# Patient Record
Sex: Male | Born: 1950
Health system: Southern US, Community
[De-identification: ages and names within clinical notes are randomized; demographics above are authoritative.]

## PROBLEM LIST (undated history)

## (undated) DIAGNOSIS — I447 Left bundle-branch block, unspecified: Secondary | ICD-10-CM

## (undated) HISTORY — PX: TONSILLECTOMY: SUR1361

## (undated) HISTORY — PX: TRANSURETHRAL RESECTION OF PROSTATE: SHX73

---

## 2002-12-05 ENCOUNTER — Other Ambulatory Visit: Admission: RE | Admit: 2002-12-05 | Discharge: 2002-12-05 | Payer: Self-pay | Admitting: Urology

## 2004-01-18 ENCOUNTER — Ambulatory Visit (HOSPITAL_COMMUNITY): Admission: RE | Admit: 2004-01-18 | Discharge: 2004-01-18 | Payer: Self-pay | Admitting: General Surgery

## 2010-08-23 ENCOUNTER — Emergency Department (HOSPITAL_COMMUNITY): Admission: EM | Admit: 2010-08-23 | Discharge: 2010-08-23 | Payer: Self-pay | Admitting: Emergency Medicine

## 2010-09-02 ENCOUNTER — Ambulatory Visit (HOSPITAL_COMMUNITY): Admission: RE | Admit: 2010-09-02 | Discharge: 2010-09-02 | Payer: Self-pay | Admitting: Urology

## 2010-09-26 ENCOUNTER — Encounter (INDEPENDENT_AMBULATORY_CARE_PROVIDER_SITE_OTHER): Payer: Self-pay | Admitting: Urology

## 2010-09-26 ENCOUNTER — Ambulatory Visit (HOSPITAL_COMMUNITY): Admission: RE | Admit: 2010-09-26 | Discharge: 2010-09-27 | Payer: Self-pay | Admitting: Urology

## 2011-02-07 LAB — CBC
MCHC: 33.7 g/dL (ref 30.0–36.0)
Platelets: 235 10*3/uL (ref 150–400)
RDW: 13.2 % (ref 11.5–15.5)
WBC: 10.3 10*3/uL (ref 4.0–10.5)

## 2011-02-07 LAB — DIFFERENTIAL
Basophils Absolute: 0 10*3/uL (ref 0.0–0.1)
Basophils Relative: 0 % (ref 0–1)
Lymphocytes Relative: 14 % (ref 12–46)
Monocytes Absolute: 0.8 10*3/uL (ref 0.1–1.0)
Neutro Abs: 7.9 10*3/uL — ABNORMAL HIGH (ref 1.7–7.7)
Neutrophils Relative %: 77 % (ref 43–77)

## 2011-02-07 LAB — BASIC METABOLIC PANEL
BUN: 8 mg/dL (ref 6–23)
Calcium: 9 mg/dL (ref 8.4–10.5)
Creatinine, Ser: 0.84 mg/dL (ref 0.4–1.5)
GFR calc non Af Amer: >60 mL/min (ref >60)
Glucose, Bld: 114 mg/dL — ABNORMAL HIGH (ref 70–99)
Potassium: 4 mEq/L (ref 3.5–5.1)

## 2011-02-08 LAB — BASIC METABOLIC PANEL
BUN: 16 mg/dL (ref 6–23)
Chloride: 104 mEq/L (ref 96–112)
GFR calc Af Amer: >60 mL/min (ref >60)
GFR calc non Af Amer: >60 mL/min (ref >60)
Potassium: 4.3 mEq/L (ref 3.5–5.1)
Sodium: 140 mEq/L (ref 135–145)

## 2011-02-08 LAB — HEMOGLOBIN AND HEMATOCRIT, BLOOD
HCT: 41 % (ref 39.0–52.0)
Hemoglobin: 13.9 g/dL (ref 13.0–17.0)

## 2011-02-08 LAB — SURGICAL PCR SCREEN: MRSA, PCR: NEGATIVE

## 2011-04-14 NOTE — H&P (Signed)
NAME:  HAZIM, TREADWAY NO.:  [id number]   MEDICAL RECORD NO.:  [id number]                  PATIENT TYPE:   LOCATION:                                       FACILITY:   PHYSICIAN:  Dalia Heading, M.D.               DATE OF BIRTH:  Sep 05, 1951   DATE OF ADMISSION:  DATE OF DISCHARGE:                                HISTORY & PHYSICAL   CHIEF COMPLAINT:  Need for screening colonoscopy due to age.   HISTORY OF PRESENT ILLNESS:  Patient is a 60 year old white male who was  referred for endoscopic evaluation.  He needs a colonoscopy for screening  purposes.  No abdominal pain, weight loss, nausea, vomiting, diarrhea,  constipation, melena, or hematochezia.  He has never had a colonoscopy.  There is no family history of colon carcinoma.  No history of hemorrhoidal  disease.   PAST MEDICAL HISTORY:  Unremarkable.   PAST SURGICAL HISTORY:  Unremarkable.   CURRENT MEDICATIONS:  Vitorin.   ALLERGIES:  No known drug allergies.   REVIEW OF SYSTEMS:  Noncontributory.   PHYSICAL EXAMINATION:  GENERAL:  Patient is a well-developed and well-  nourished white male in no acute distress.  VITAL SIGNS:  He is afebrile.  Vital signs are stable.  LUNGS:  Clear to auscultation with equal breath sounds bilaterally.  HEART:  Regular rate and rhythm without S3, S4, or murmurs.  ABDOMEN:  Soft, nontender, nondistended.  No hepatosplenomegaly or masses  are noted.  RECTAL:  Deferred at this procedure.   IMPRESSION:  Need for screening colonoscopy.   PLAN:  The patient is scheduled for a colonoscopy on January 18, 2004.  The  risks and benefits of the procedure, including bleeding and perforation,  were fully explained to the patient.  He gave informed consent.     ___________________________________________                                         Dalia Heading, M.D.   MAJ/MEDQ  D:  12/31/2003  T:  12/31/2003  Job:  [id number]   cc:   Patrica Duel, M.D.  [address], [address]  [place]  [place] [address]  Fax: [phone]

## 2013-04-17 NOTE — H&P (Signed)
  NTS SOAP Note  Vital Signs:  Vitals as of: 04/17/2013: Systolic 126: Diastolic 84: Heart Rate 75: Temp 98.12F: Height 42ft 9in: Weight 167Lbs 0 Ounces: BMI 24.66  BMI : 24.66 kg/m2  Subjective: This 81 Years 34 Months old Male presents for screening TCS.  Last had a TCS over ten years ago.  Denies any GI complaints.  No family h/o colon cancer.   Review of Symptoms:  Constitutional:unremarkable   Head:unremarkable    Eyes:unremarkable   Nose/Mouth/Throat:unremarkable Cardiovascular:  unremarkable   Respiratory:unremarkable   Gastrointestinal:  unremarkable   Genitourinary:unremarkable     Musculoskeletal:unremarkable   Skin:unremarkable Hematolgic/Lymphatic:unremarkable     Allergic/Immunologic:unremarkable     Past Medical History:    Reviewed   Past Medical History  Surgical History: TURP Allergies: nkda Medications: vytorin, baby asa   Social History:Reviewed  Social History  Preferred Language: English Race:  White Ethnicity: Not Hispanic / Latino Age: 28 Years 9 Months Marital Status:  M Alcohol:  No Recreational drug(s):  No   Smoking Status: Never smoker reviewed on 04/17/2013 Functional Status reviewed on mm/dd/yyyy ------------------------------------------------ Bathing: Normal Cooking: Normal Dressing: Normal Driving: Normal Eating: Normal Managing Meds: Normal Oral Care: Normal Shopping: Normal Toileting: Normal Transferring: Normal Walking: Normal Cognitive Status reviewed on mm/dd/yyyy ------------------------------------------------ Attention: Normal Decision Making: Normal Language: Normal Memory: Normal Motor: Normal Perception: Normal Problem Solving: Normal Visual and Spatial: Normal   Family History:  Reviewed  Family Health History Mother, Unknown; Healthy;  Father, Unknown; Healthy;     Objective Information: General:  Well appearing, well nourished in no  distress. Heart:  RRR, no murmur Lungs:    CTA bilaterally, no wheezes, rhonchi, rales.  Breathing unlabored. Abdomen:Soft, NT/ND, no HSM, no masses.   deferred to procedure  Assessment:Need for screening TCS  Diagnosis &amp; Procedure Smart Code   Plan: Scheduled for TCS on 04/29/13.  Patient Education:Alternative treatments to surgery were discussed with patient (and family).  Risks and benefits  of procedure including bleeding and perforation were fully explained to the patient (and family) who gave informed consent. Patient/family questions were addressed.  Follow-up:Pending Surgery

## 2013-04-23 ENCOUNTER — Encounter (HOSPITAL_COMMUNITY): Payer: Self-pay | Admitting: Pharmacy Technician

## 2013-04-29 ENCOUNTER — Encounter (HOSPITAL_COMMUNITY)
Admission: RE | Disposition: A | Discharge status: Home / Self Care | Payer: Self-pay | Source: Ambulatory Visit | Attending: General Surgery

## 2013-04-29 ENCOUNTER — Ambulatory Visit (HOSPITAL_COMMUNITY)
Admission: RE | Admit: 2013-04-29 | Discharge: 2013-04-29 | Disposition: A | Discharge status: Home / Self Care | Payer: 59 | Source: Ambulatory Visit | Attending: General Surgery | Admitting: General Surgery

## 2013-04-29 ENCOUNTER — Encounter (HOSPITAL_COMMUNITY): Payer: Self-pay | Admitting: *Deleted

## 2013-04-29 DIAGNOSIS — Z1211 Encounter for screening for malignant neoplasm of colon: Secondary | ICD-10-CM | POA: Insufficient documentation

## 2013-04-29 HISTORY — DX: Left bundle-branch block, unspecified: I44.7

## 2013-04-29 HISTORY — PX: COLONOSCOPY: SHX5424

## 2013-04-29 SURGERY — COLONOSCOPY
Anesthesia: Moderate Sedation

## 2013-04-29 MED ORDER — SODIUM CHLORIDE 0.9 % IV SOLN
INTRAVENOUS | Status: DC
  Administered 2013-04-29: 1000 mL via INTRAVENOUS

## 2013-04-29 MED ORDER — MIDAZOLAM HCL 5 MG/5ML IJ SOLN
INTRAMUSCULAR | Status: AC
  Filled 2013-04-29: qty 10

## 2013-04-29 MED ORDER — MEPERIDINE HCL 50 MG/ML IJ SOLN
INTRAMUSCULAR | Status: AC
  Filled 2013-04-29: qty 1

## 2013-04-29 MED ORDER — MIDAZOLAM HCL 5 MG/5ML IJ SOLN
INTRAMUSCULAR | Status: DC | PRN
  Administered 2013-04-29: 3 mg via INTRAVENOUS
  Administered 2013-04-29: 1 mg via INTRAVENOUS

## 2013-04-29 MED ORDER — SIMETHICONE 40 MG/0.6ML PO SUSP
ORAL | Status: DC | PRN
  Administered 2013-04-29: 09:00:00

## 2013-04-29 MED ORDER — MEPERIDINE HCL 25 MG/ML IJ SOLN
INTRAMUSCULAR | Status: DC | PRN
  Administered 2013-04-29: 50 mg via INTRAVENOUS

## 2013-04-29 NOTE — Op Note (Signed)
Patient:  Michael Faulkner  DOB:  05/25/1951  MRN:  [id number]   Preop Diagnosis:  Screening colonoscopy  Postop Diagnosis:  Same  Procedure:  Colonoscopy  Surgeon:  Franky Macho, M.D.  Anes:  Demerol 50 mg IV, Versed 4 mg IV  Indications:  Patient is a 62 year old white male who presents for screening colonoscopy. The risks and benefits of the procedure including bleeding and perforation were fully explained to the patient, who gave informed consent.  Procedure note:  After placement of cardiopulmonary monitoring equipment, the patient was placed in the left lateral decubitus position. Demerol and Versed were used for anesthesia. Rectal examination was unremarkable.  The colonoscope to advance to the cecum without difficulty. Confirmation of placement the cecum was done using transabdominal palpation and landmarks. The bowel preparation was adequate. The cecum was fully visualized and noted to be within normal limits. The ascending colon, transverse colon, descending colon, sigmoid colon, and rectum were all within normal limits. No abnormal lesions were noted. Retroflexion of the colonoscope in the rectum was unremarkable. The colonoscope was returned to the neutral position and all air was evacuated from the colon and rectum prior to removal the colonoscope. Total time in evaluating the colon was 6 minutes.  Patient tolerated procedure well and was transferred back to Day Surgery in stable condition.  Complications:  None  EBL:  None  Specimen:  None  Recommendations: Screening colonoscopy is recommended in 10 years.

## 2013-05-02 ENCOUNTER — Encounter (HOSPITAL_COMMUNITY): Payer: Self-pay | Admitting: General Surgery

## 2014-12-29 ENCOUNTER — Ambulatory Visit (INDEPENDENT_AMBULATORY_CARE_PROVIDER_SITE_OTHER): Payer: 59 | Admitting: Urology

## 2014-12-29 DIAGNOSIS — N401 Enlarged prostate with lower urinary tract symptoms: Secondary | ICD-10-CM

## 2016-02-15 ENCOUNTER — Ambulatory Visit (INDEPENDENT_AMBULATORY_CARE_PROVIDER_SITE_OTHER): Payer: Commercial Managed Care - HMO | Admitting: Urology

## 2016-02-15 DIAGNOSIS — N401 Enlarged prostate with lower urinary tract symptoms: Secondary | ICD-10-CM

## 2016-02-15 DIAGNOSIS — R972 Elevated prostate specific antigen [PSA]: Secondary | ICD-10-CM | POA: Diagnosis not present

## 2016-08-23 DIAGNOSIS — R972 Elevated prostate specific antigen [PSA]: Secondary | ICD-10-CM | POA: Diagnosis not present

## 2016-11-29 DIAGNOSIS — R972 Elevated prostate specific antigen [PSA]: Secondary | ICD-10-CM | POA: Diagnosis not present

## 2016-11-29 DIAGNOSIS — E782 Mixed hyperlipidemia: Secondary | ICD-10-CM | POA: Diagnosis not present

## 2016-11-29 DIAGNOSIS — E785 Hyperlipidemia, unspecified: Secondary | ICD-10-CM | POA: Diagnosis not present

## 2016-11-29 DIAGNOSIS — I10 Essential (primary) hypertension: Secondary | ICD-10-CM | POA: Diagnosis not present

## 2016-11-29 DIAGNOSIS — R7301 Impaired fasting glucose: Secondary | ICD-10-CM | POA: Diagnosis not present

## 2016-11-29 DIAGNOSIS — N529 Male erectile dysfunction, unspecified: Secondary | ICD-10-CM | POA: Diagnosis not present

## 2016-11-29 DIAGNOSIS — E119 Type 2 diabetes mellitus without complications: Secondary | ICD-10-CM | POA: Diagnosis not present

## 2016-11-29 DIAGNOSIS — E039 Hypothyroidism, unspecified: Secondary | ICD-10-CM | POA: Diagnosis not present

## 2016-12-04 DIAGNOSIS — Z0001 Encounter for general adult medical examination with abnormal findings: Secondary | ICD-10-CM | POA: Diagnosis not present

## 2016-12-04 DIAGNOSIS — Z6825 Body mass index (BMI) 25.0-25.9, adult: Secondary | ICD-10-CM | POA: Diagnosis not present

## 2016-12-04 DIAGNOSIS — Z23 Encounter for immunization: Secondary | ICD-10-CM | POA: Diagnosis not present

## 2016-12-04 DIAGNOSIS — R7301 Impaired fasting glucose: Secondary | ICD-10-CM | POA: Diagnosis not present

## 2016-12-04 DIAGNOSIS — R972 Elevated prostate specific antigen [PSA]: Secondary | ICD-10-CM | POA: Diagnosis not present

## 2016-12-04 DIAGNOSIS — E782 Mixed hyperlipidemia: Secondary | ICD-10-CM | POA: Diagnosis not present

## 2017-03-12 DIAGNOSIS — N400 Enlarged prostate without lower urinary tract symptoms: Secondary | ICD-10-CM | POA: Diagnosis not present

## 2017-03-20 ENCOUNTER — Ambulatory Visit (INDEPENDENT_AMBULATORY_CARE_PROVIDER_SITE_OTHER): Payer: Medicare Other | Admitting: Urology

## 2017-03-20 DIAGNOSIS — R972 Elevated prostate specific antigen [PSA]: Secondary | ICD-10-CM | POA: Diagnosis not present

## 2017-03-20 DIAGNOSIS — N401 Enlarged prostate with lower urinary tract symptoms: Secondary | ICD-10-CM

## 2017-07-05 DIAGNOSIS — R7301 Impaired fasting glucose: Secondary | ICD-10-CM | POA: Diagnosis not present

## 2017-07-05 DIAGNOSIS — E782 Mixed hyperlipidemia: Secondary | ICD-10-CM | POA: Diagnosis not present

## 2017-07-09 DIAGNOSIS — R7301 Impaired fasting glucose: Secondary | ICD-10-CM | POA: Diagnosis not present

## 2017-07-09 DIAGNOSIS — E782 Mixed hyperlipidemia: Secondary | ICD-10-CM | POA: Diagnosis not present

## 2017-07-09 DIAGNOSIS — R972 Elevated prostate specific antigen [PSA]: Secondary | ICD-10-CM | POA: Diagnosis not present

## 2017-07-09 DIAGNOSIS — Z6824 Body mass index (BMI) 24.0-24.9, adult: Secondary | ICD-10-CM | POA: Diagnosis not present

## 2017-09-12 DIAGNOSIS — H18413 Arcus senilis, bilateral: Secondary | ICD-10-CM | POA: Diagnosis not present

## 2017-09-12 DIAGNOSIS — H25013 Cortical age-related cataract, bilateral: Secondary | ICD-10-CM | POA: Diagnosis not present

## 2017-09-12 DIAGNOSIS — H43813 Vitreous degeneration, bilateral: Secondary | ICD-10-CM | POA: Diagnosis not present

## 2017-09-12 DIAGNOSIS — H43393 Other vitreous opacities, bilateral: Secondary | ICD-10-CM | POA: Diagnosis not present

## 2018-01-10 DIAGNOSIS — E782 Mixed hyperlipidemia: Secondary | ICD-10-CM | POA: Diagnosis not present

## 2018-01-10 DIAGNOSIS — R972 Elevated prostate specific antigen [PSA]: Secondary | ICD-10-CM | POA: Diagnosis not present

## 2018-01-10 DIAGNOSIS — R7301 Impaired fasting glucose: Secondary | ICD-10-CM | POA: Diagnosis not present

## 2018-01-14 DIAGNOSIS — Z6826 Body mass index (BMI) 26.0-26.9, adult: Secondary | ICD-10-CM | POA: Diagnosis not present

## 2018-01-14 DIAGNOSIS — R972 Elevated prostate specific antigen [PSA]: Secondary | ICD-10-CM | POA: Diagnosis not present

## 2018-01-14 DIAGNOSIS — E782 Mixed hyperlipidemia: Secondary | ICD-10-CM | POA: Diagnosis not present

## 2018-01-14 DIAGNOSIS — R7301 Impaired fasting glucose: Secondary | ICD-10-CM | POA: Diagnosis not present

## 2018-03-20 DIAGNOSIS — R972 Elevated prostate specific antigen [PSA]: Secondary | ICD-10-CM | POA: Diagnosis not present

## 2018-04-02 ENCOUNTER — Ambulatory Visit (INDEPENDENT_AMBULATORY_CARE_PROVIDER_SITE_OTHER): Payer: Medicare Other | Admitting: Urology

## 2018-04-02 DIAGNOSIS — R972 Elevated prostate specific antigen [PSA]: Secondary | ICD-10-CM

## 2018-04-02 DIAGNOSIS — N401 Enlarged prostate with lower urinary tract symptoms: Secondary | ICD-10-CM | POA: Diagnosis not present

## 2018-04-23 DIAGNOSIS — R972 Elevated prostate specific antigen [PSA]: Secondary | ICD-10-CM | POA: Diagnosis not present

## 2018-05-16 ENCOUNTER — Other Ambulatory Visit: Payer: Self-pay | Admitting: Urology

## 2018-05-16 DIAGNOSIS — R972 Elevated prostate specific antigen [PSA]: Secondary | ICD-10-CM

## 2018-05-29 ENCOUNTER — Ambulatory Visit
Admission: RE | Admit: 2018-05-29 | Discharge: 2018-05-29 | Disposition: A | Discharge status: Home / Self Care | Payer: Medicare Other | Source: Ambulatory Visit | Attending: Urology | Admitting: Urology

## 2018-05-29 DIAGNOSIS — R972 Elevated prostate specific antigen [PSA]: Secondary | ICD-10-CM | POA: Diagnosis not present

## 2018-05-29 MED ORDER — GADOBENATE DIMEGLUMINE 529 MG/ML IV SOLN
15.0000 mL | Freq: Once | INTRAVENOUS | Status: AC | PRN
  Administered 2018-05-29: 15 mL via INTRAVENOUS

## 2018-06-18 ENCOUNTER — Other Ambulatory Visit: Payer: Self-pay | Admitting: Urology

## 2018-06-18 DIAGNOSIS — R972 Elevated prostate specific antigen [PSA]: Secondary | ICD-10-CM

## 2018-06-25 DIAGNOSIS — R972 Elevated prostate specific antigen [PSA]: Secondary | ICD-10-CM | POA: Diagnosis not present

## 2018-06-25 DIAGNOSIS — R7301 Impaired fasting glucose: Secondary | ICD-10-CM | POA: Diagnosis not present

## 2018-06-25 DIAGNOSIS — E782 Mixed hyperlipidemia: Secondary | ICD-10-CM | POA: Diagnosis not present

## 2018-07-01 ENCOUNTER — Other Ambulatory Visit: Payer: Self-pay

## 2018-07-04 DIAGNOSIS — E782 Mixed hyperlipidemia: Secondary | ICD-10-CM | POA: Diagnosis not present

## 2018-07-04 DIAGNOSIS — R972 Elevated prostate specific antigen [PSA]: Secondary | ICD-10-CM | POA: Diagnosis not present

## 2018-07-04 DIAGNOSIS — R7301 Impaired fasting glucose: Secondary | ICD-10-CM | POA: Diagnosis not present

## 2018-07-04 DIAGNOSIS — Z6825 Body mass index (BMI) 25.0-25.9, adult: Secondary | ICD-10-CM | POA: Diagnosis not present

## 2018-07-16 ENCOUNTER — Ambulatory Visit (HOSPITAL_COMMUNITY)
Admission: RE | Admit: 2018-07-16 | Discharge: 2018-07-16 | Disposition: A | Discharge status: Home / Self Care | Payer: Medicare Other | Source: Ambulatory Visit | Attending: Urology | Admitting: Urology

## 2018-07-16 DIAGNOSIS — Z79899 Other long term (current) drug therapy: Secondary | ICD-10-CM | POA: Insufficient documentation

## 2018-07-16 DIAGNOSIS — N401 Enlarged prostate with lower urinary tract symptoms: Secondary | ICD-10-CM | POA: Insufficient documentation

## 2018-07-16 DIAGNOSIS — Z7982 Long term (current) use of aspirin: Secondary | ICD-10-CM | POA: Insufficient documentation

## 2018-07-16 DIAGNOSIS — Z9889 Other specified postprocedural states: Secondary | ICD-10-CM | POA: Diagnosis not present

## 2018-07-16 DIAGNOSIS — R972 Elevated prostate specific antigen [PSA]: Secondary | ICD-10-CM | POA: Diagnosis not present

## 2018-07-16 DIAGNOSIS — Z801 Family history of malignant neoplasm of trachea, bronchus and lung: Secondary | ICD-10-CM | POA: Insufficient documentation

## 2018-07-16 DIAGNOSIS — Z87891 Personal history of nicotine dependence: Secondary | ICD-10-CM | POA: Diagnosis not present

## 2018-07-16 MED ORDER — GENTAMICIN SULFATE 40 MG/ML IJ SOLN
160.0000 mg | Freq: Once | INTRAMUSCULAR | Status: AC
  Administered 2018-07-16: 160 mg via INTRAMUSCULAR

## 2018-07-16 MED ORDER — LIDOCAINE HCL (PF) 2 % IJ SOLN
10.0000 mL | Freq: Once | INTRAMUSCULAR | Status: AC
  Administered 2018-07-16: 10 mL

## 2018-07-16 MED ORDER — LIDOCAINE HCL (PF) 2 % IJ SOLN
INTRAMUSCULAR | Status: AC
  Administered 2018-07-16: 10 mL
  Filled 2018-07-16: qty 10

## 2018-07-16 MED ORDER — GENTAMICIN SULFATE 40 MG/ML IJ SOLN
INTRAMUSCULAR | Status: AC
  Administered 2018-07-16: 160 mg via INTRAMUSCULAR
  Filled 2018-07-16: qty 4

## 2018-08-06 ENCOUNTER — Other Ambulatory Visit (HOSPITAL_COMMUNITY): Payer: Medicare Other

## 2018-10-02 DIAGNOSIS — H43813 Vitreous degeneration, bilateral: Secondary | ICD-10-CM | POA: Diagnosis not present

## 2018-10-02 DIAGNOSIS — H25013 Cortical age-related cataract, bilateral: Secondary | ICD-10-CM | POA: Diagnosis not present

## 2018-10-02 DIAGNOSIS — H43393 Other vitreous opacities, bilateral: Secondary | ICD-10-CM | POA: Diagnosis not present

## 2018-10-02 DIAGNOSIS — H18413 Arcus senilis, bilateral: Secondary | ICD-10-CM | POA: Diagnosis not present

## 2018-11-04 DIAGNOSIS — Z23 Encounter for immunization: Secondary | ICD-10-CM | POA: Diagnosis not present

## 2019-01-09 DIAGNOSIS — R972 Elevated prostate specific antigen [PSA]: Secondary | ICD-10-CM | POA: Diagnosis not present

## 2019-01-09 DIAGNOSIS — R7301 Impaired fasting glucose: Secondary | ICD-10-CM | POA: Diagnosis not present

## 2019-01-09 DIAGNOSIS — E782 Mixed hyperlipidemia: Secondary | ICD-10-CM | POA: Diagnosis not present

## 2019-01-23 DIAGNOSIS — R7301 Impaired fasting glucose: Secondary | ICD-10-CM | POA: Diagnosis not present

## 2019-01-23 DIAGNOSIS — E782 Mixed hyperlipidemia: Secondary | ICD-10-CM | POA: Diagnosis not present

## 2019-01-23 DIAGNOSIS — R972 Elevated prostate specific antigen [PSA]: Secondary | ICD-10-CM | POA: Diagnosis not present

## 2019-04-29 ENCOUNTER — Ambulatory Visit (INDEPENDENT_AMBULATORY_CARE_PROVIDER_SITE_OTHER): Payer: Medicare Other | Admitting: Urology

## 2019-04-29 DIAGNOSIS — R351 Nocturia: Secondary | ICD-10-CM | POA: Diagnosis not present

## 2019-04-29 DIAGNOSIS — N401 Enlarged prostate with lower urinary tract symptoms: Secondary | ICD-10-CM

## 2019-04-29 DIAGNOSIS — R972 Elevated prostate specific antigen [PSA]: Secondary | ICD-10-CM

## 2019-05-17 DIAGNOSIS — Z0000 Encounter for general adult medical examination without abnormal findings: Secondary | ICD-10-CM | POA: Diagnosis not present

## 2019-09-16 DIAGNOSIS — Z23 Encounter for immunization: Secondary | ICD-10-CM | POA: Diagnosis not present

## 2019-10-13 IMAGING — MR MR PROSTATE WO/W CM
56 series · 56 of 56 positions shown · IV contrast (Multihance 15 ml)
Comparison: CT pelvis dated 09/02/2010

CLINICAL DATA: Rising PSA

EXAM:
MR PROSTATE WITHOUT AND WITH CONTRAST
TECHNIQUE: Multiplanar multisequence MRI images were obtained of the pelvis
centered about the prostate. Pre and post contrast images were
obtained.
CONTRAST:  15mL MULTIHANCE GADOBENATE DIMEGLUMINE 529 MG/ML IV SOLN
Creatinine was obtained on site at [HOSPITAL] at [HOSPITAL].
Results: Creatinine 0.8 mg/dL.

[Series 2: new loc · axial · 6.0mm · 0.88mm/px · 1 of 29 slices shown]
[im 1/29]
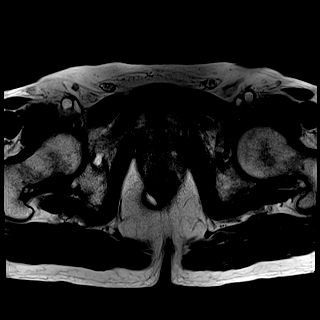

[Series 3: T1 · axial · 8.0mm · 1.06mm/px · 1 of 28 slices shown (1 of 2)]
[im 1/28]
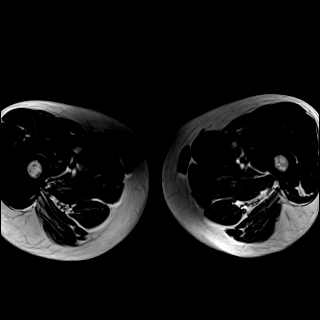

[Series 4: bSSFP fat-sat · axial · 8.0mm · 0.74mm/px · 1 of 28 slices shown]
[im 1/28]
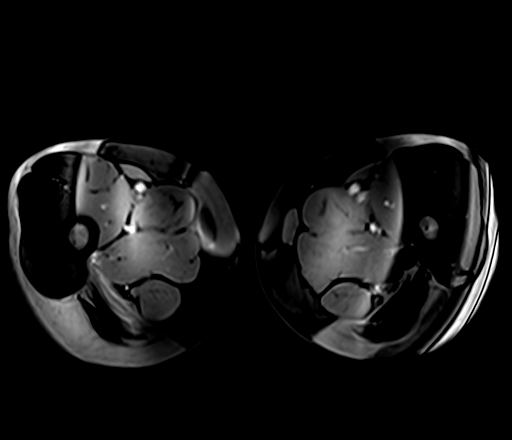

[Series 5: T2 · sagittal · 3.5mm · 0.56mm/px · 1 of 39 slices shown (1 of 4)]
[im 1/39]
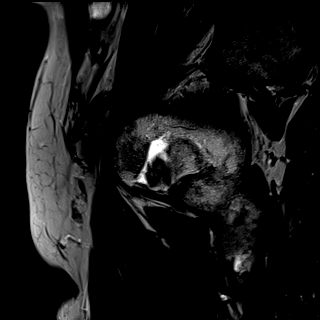

[Series 6: T1 · axial · 3.0mm · 0.31mm/px · 1 of 24 slices shown (2 of 2)]
[im 1/24]
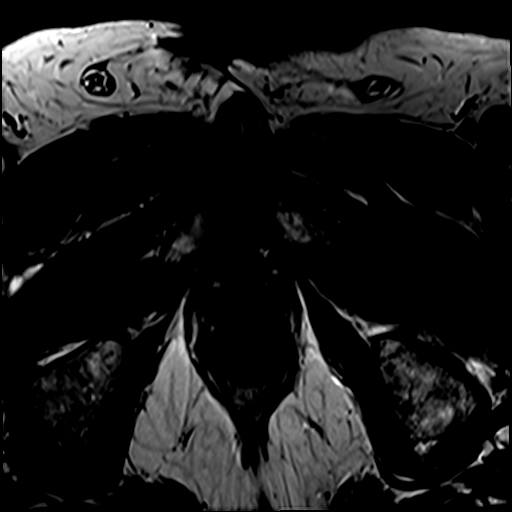

[Series 7: T2 · axial · 3.5mm · 0.56mm/px · 1 of 23 slices shown (2 of 4)]
[im 1/23]
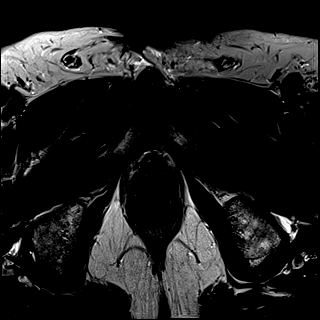

[Series 8: T2 · axial · 1.0mm · 1.04mm/px · 1 of 80 slices shown (3 of 4)]
[im 1/80]
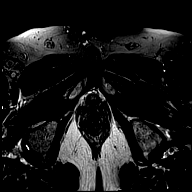

[Series 9: T2 · coronal · 3.5mm · 0.56mm/px · 1 of 23 slices shown (4 of 4)]
[im 1/23]
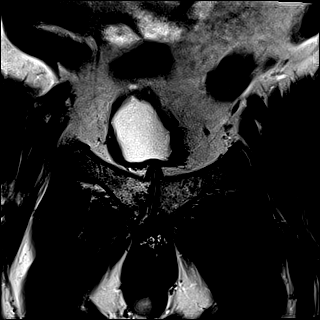

[Series 10: DWI · axial · 3.5mm · 1.56mm/px · 1 of 60 slices shown (1 of 2)]
[im 1/60]
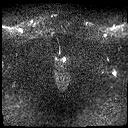

[Series 11: DWI · axial · 3.5mm · 1.56mm/px · 1 of 20 slices shown (2 of 2)]
[im 1/20]
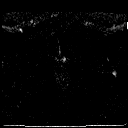

[Series 12: pre t1_twist_tra_dyn_ttc=5.6s · axial · non-contrast · 3.5mm · 0.83mm/px · 1 of 22 slices shown]
[im 1/22]
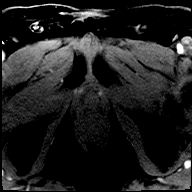

[Series 13: post t1_twist_tra_dyn-copy center · axial · 3.5mm · 0.83mm/px · 1 of 22 slices shown (1 of 23)]
[im 1/22]
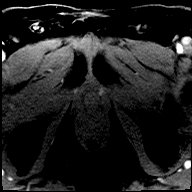

[Series 14: post t1_twist_tra_dyn-copy center · axial · 3.5mm · 0.83mm/px · 1 of 22 slices shown (2 of 23)]
[im 1/22]
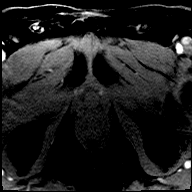

[Series 15: post t1_twist_tra_dyn-copy cent_sub_ttc=(id) · axial · 3.5mm · 0.83mm/px · 1 of 22 slices shown (1 of 22)]
[im 1/22]
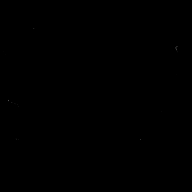

[Series 16: post t1_twist_tra_dyn-copy center · axial · 3.5mm · 0.83mm/px · 1 of 22 slices shown (3 of 23)]
[im 1/22]
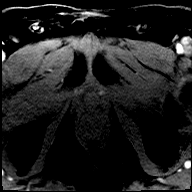

[Series 17: post t1_twist_tra_dyn-copy cent_sub_ttc=(id) · axial · 3.5mm · 0.83mm/px · 1 of 22 slices shown (2 of 22)]
[im 1/22]
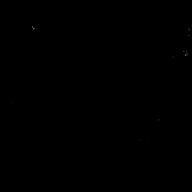

[Series 18: post t1_twist_tra_dyn-copy center · axial · 3.5mm · 0.83mm/px · 1 of 22 slices shown (4 of 23)]
[im 1/22]
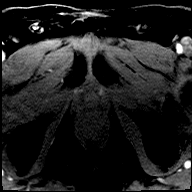

[Series 19: post t1_twist_tra_dyn-copy cent_sub_ttc=(id) · axial · 3.5mm · 0.83mm/px · 1 of 22 slices shown (3 of 22)]
[im 1/22]
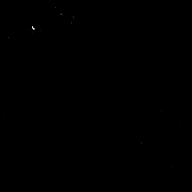

[Series 20: post t1_twist_tra_dyn-copy center · axial · 3.5mm · 0.83mm/px · 1 of 22 slices shown (5 of 23)]
[im 1/22]
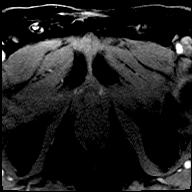

[Series 21: post t1_twist_tra_dyn-copy cent_sub_ttc=(id) · axial · 3.5mm · 0.83mm/px · 1 of 21 slices shown (4 of 22)]
[im 1/21]
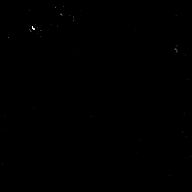

[Series 22: post t1_twist_tra_dyn-copy center · axial · 3.5mm · 0.83mm/px · 1 of 22 slices shown (6 of 23)]
[im 1/22]
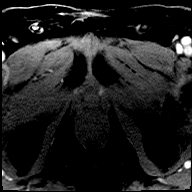

[Series 23: post t1_twist_tra_dyn-copy cent_sub_ttc=(id) · axial · 3.5mm · 0.83mm/px · 1 of 22 slices shown (5 of 22)]
[im 1/22]
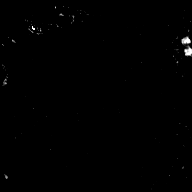

[Series 24: post t1_twist_tra_dyn-copy center · axial · 3.5mm · 0.83mm/px · 1 of 22 slices shown (7 of 23)]
[im 1/22]
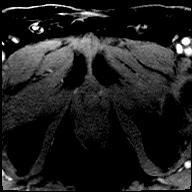

[Series 25: post t1_twist_tra_dyn-copy cent_sub_ttc=(id) · axial · 3.5mm · 0.83mm/px · 1 of 22 slices shown (6 of 22)]
[im 1/22]
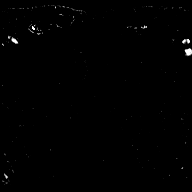

[Series 26: post t1_twist_tra_dyn-copy center · axial · 3.5mm · 0.83mm/px · 1 of 22 slices shown (8 of 23)]
[im 1/22]
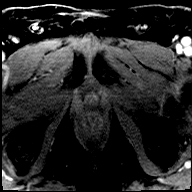

[Series 27: post t1_twist_tra_dyn-copy cent_sub_ttc=(id) · axial · 3.5mm · 0.83mm/px · 1 of 22 slices shown (7 of 22)]
[im 1/22]
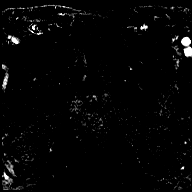

[Series 28: post t1_twist_tra_dyn-copy center · axial · 3.5mm · 0.83mm/px · 1 of 22 slices shown (9 of 23)]
[im 1/22]
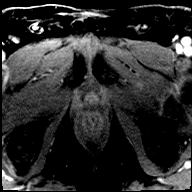

[Series 29: post t1_twist_tra_dyn-copy cent_sub_ttc=(id) · axial · 3.5mm · 0.83mm/px · 1 of 22 slices shown (8 of 22)]
[im 1/22]
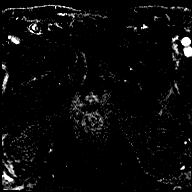

[Series 30: post t1_twist_tra_dyn-copy center · axial · 3.5mm · 0.83mm/px · 1 of 22 slices shown (10 of 23)]
[im 1/22]
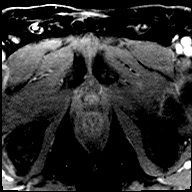

[Series 31: post t1_twist_tra_dyn-copy cent_sub_ttc=(id) · axial · 3.5mm · 0.83mm/px · 1 of 22 slices shown (9 of 22)]
[im 1/22]
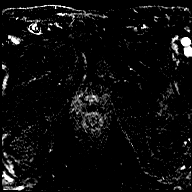

[Series 32: post t1_twist_tra_dyn-copy center · axial · 3.5mm · 0.83mm/px · 1 of 22 slices shown (11 of 23)]
[im 1/22]
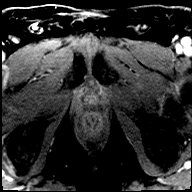

[Series 33: post t1_twist_tra_dyn-copy cent_sub_ttc=(id) · axial · 3.5mm · 0.83mm/px · 1 of 22 slices shown (10 of 22)]
[im 1/22]
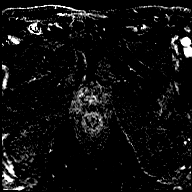

[Series 34: post t1_twist_tra_dyn-copy center · axial · 3.5mm · 0.83mm/px · 1 of 22 slices shown (12 of 23)]
[im 1/22]
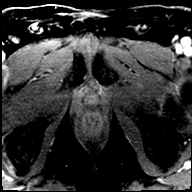

[Series 35: post t1_twist_tra_dyn-copy cent_sub_ttc=(id) · axial · 3.5mm · 0.83mm/px · 1 of 22 slices shown (11 of 22)]
[im 1/22]
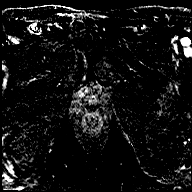

[Series 36: post t1_twist_tra_dyn-copy center · axial · 3.5mm · 0.83mm/px · 1 of 22 slices shown (13 of 23)]
[im 1/22]
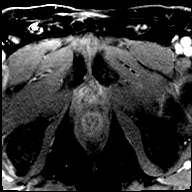

[Series 37: post t1_twist_tra_dyn-copy cent_sub_ttc=(id) · axial · 3.5mm · 0.83mm/px · 1 of 22 slices shown (12 of 22)]
[im 1/22]
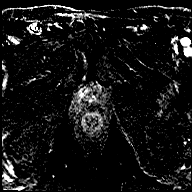

[Series 38: post t1_twist_tra_dyn-copy center · axial · 3.5mm · 0.83mm/px · 1 of 22 slices shown (14 of 23)]
[im 1/22]
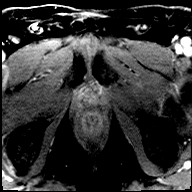

[Series 39: post t1_twist_tra_dyn-copy cent_sub_ttc=(id) · axial · 3.5mm · 0.83mm/px · 1 of 22 slices shown (13 of 22)]
[im 1/22]
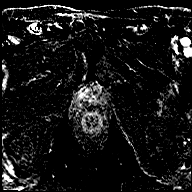

[Series 40: post t1_twist_tra_dyn-copy center · axial · 3.5mm · 0.83mm/px · 1 of 22 slices shown (15 of 23)]
[im 1/22]
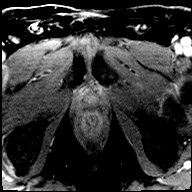

[Series 41: post t1_twist_tra_dyn-copy cent_sub_ttc=(id) · axial · 3.5mm · 0.83mm/px · 1 of 22 slices shown (14 of 22)]
[im 1/22]
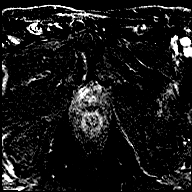

[Series 42: post t1_twist_tra_dyn-copy center · axial · 3.5mm · 0.83mm/px · 1 of 22 slices shown (16 of 23)]
[im 1/22]
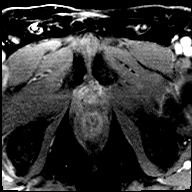

[Series 43: post t1_twist_tra_dyn-copy cent_sub_ttc=(id) · axial · 3.5mm · 0.83mm/px · 1 of 22 slices shown (15 of 22)]
[im 1/22]
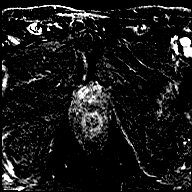

[Series 44: post t1_twist_tra_dyn-copy center · axial · 3.5mm · 0.83mm/px · 1 of 22 slices shown (17 of 23)]
[im 1/22]
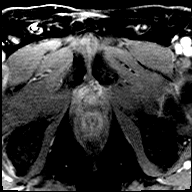

[Series 45: post t1_twist_tra_dyn-copy cent_sub_ttc=(id) · axial · 3.5mm · 0.83mm/px · 1 of 22 slices shown (16 of 22)]
[im 1/22]
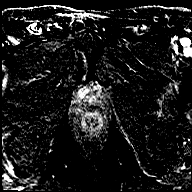

[Series 46: post t1_twist_tra_dyn-copy center · axial · 3.5mm · 0.83mm/px · 1 of 22 slices shown (18 of 23)]
[im 1/22]
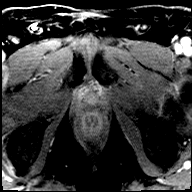

[Series 47: post t1_twist_tra_dyn-copy cent_sub_ttc=(id) · axial · 3.5mm · 0.83mm/px · 1 of 22 slices shown (17 of 22)]
[im 1/22]
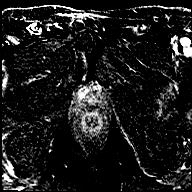

[Series 48: post t1_twist_tra_dyn-copy center · axial · 3.5mm · 0.83mm/px · 1 of 22 slices shown (19 of 23)]
[im 1/22]
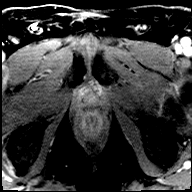

[Series 49: post t1_twist_tra_dyn-copy cent_sub_ttc=(id) · axial · 3.5mm · 0.83mm/px · 1 of 22 slices shown (18 of 22)]
[im 1/22]
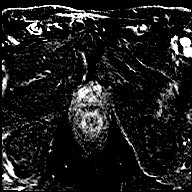

[Series 50: post t1_twist_tra_dyn-copy center · axial · 3.5mm · 0.83mm/px · 1 of 22 slices shown (20 of 23)]
[im 1/22]
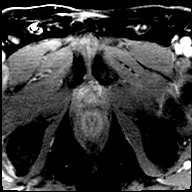

[Series 51: post t1_twist_tra_dyn-copy cent_sub_ttc=(id) · axial · 3.5mm · 0.83mm/px · 1 of 22 slices shown (19 of 22)]
[im 1/22]
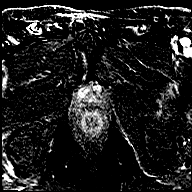

[Series 52: post t1_twist_tra_dyn-copy center · axial · 3.5mm · 0.83mm/px · 1 of 22 slices shown (21 of 23)]
[im 1/22]
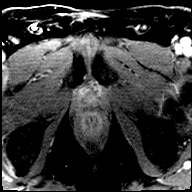

[Series 53: post t1_twist_tra_dyn-copy cent_sub_ttc=(id) · axial · 3.5mm · 0.83mm/px · 1 of 22 slices shown (20 of 22)]
[im 1/22]
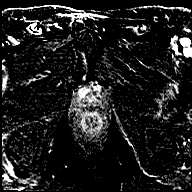

[Series 54: post t1_twist_tra_dyn-copy center · axial · 3.5mm · 0.83mm/px · 1 of 22 slices shown (22 of 23)]
[im 1/22]
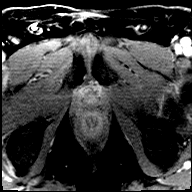

[Series 55: post t1_twist_tra_dyn-copy cent_sub_ttc=(id) · axial · 3.5mm · 0.83mm/px · 1 of 22 slices shown (21 of 22)]
[im 1/22]
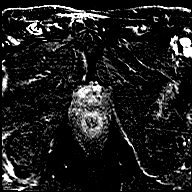

[Series 56: post t1_twist_tra_dyn-copy center · axial · 3.5mm · 0.83mm/px · 1 of 22 slices shown (23 of 23)]
[im 1/22]
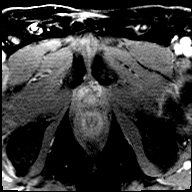

[Series 57: post t1_twist_tra_dyn-copy cent_sub_ttc=(id) · axial · 3.5mm · 0.83mm/px · 1 of 22 slices shown (22 of 22)]
[im 1/22]
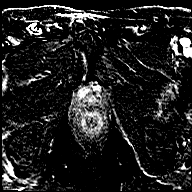

[56 of 56 positions shown; findings below may reference images not displayed]

FINDINGS: Prostate: No findings suspicious for macroscopic prostate cancer.

Specifically, no focal low T2 lesion within the peripheral zone. No
early arterial enhancement. No restricted diffusion. Thinning of the
peripheral zone.

Marked enlargement/nodularity of the central gland, suggesting BPH.
No suspicious central gland nodule on T2. Postsurgical changes
related to prior TURP.

Volume: 5.0 x 5.8 x 5.0 cm (volume = 76 mL)

Transcapsular spread:  Absent.

Seminal vesicle involvement: Absent.

Neurovascular bundle involvement: Absent.

Pelvic adenopathy: Absent.

Bone metastasis: Absent.

Other findings: Bladder is mildly thick-walled although
underdistended.
IMPRESSION: No findings suspicious for macroscopic prostate cancer.

Prostatomegaly, suggesting BPH. Postsurgical changes related to
prior TURP.

Prostate volume 76 mL.

## 2019-12-18 DIAGNOSIS — R972 Elevated prostate specific antigen [PSA]: Secondary | ICD-10-CM | POA: Diagnosis not present

## 2019-12-19 ENCOUNTER — Other Ambulatory Visit: Payer: Self-pay | Admitting: Urology

## 2019-12-30 ENCOUNTER — Other Ambulatory Visit: Payer: Self-pay

## 2019-12-30 ENCOUNTER — Encounter: Payer: Self-pay | Admitting: Urology

## 2019-12-30 ENCOUNTER — Ambulatory Visit (INDEPENDENT_AMBULATORY_CARE_PROVIDER_SITE_OTHER): Payer: Medicare Other | Admitting: Urology

## 2019-12-30 VITALS — BP 139/84 | HR 75 | Temp 98.7°F | Ht 69.0 in | Wt 165.0 lb

## 2019-12-30 DIAGNOSIS — N400 Enlarged prostate without lower urinary tract symptoms: Secondary | ICD-10-CM | POA: Diagnosis not present

## 2019-12-30 LAB — POCT URINALYSIS DIPSTICK
Bilirubin, UA: NEGATIVE
Glucose, UA: NEGATIVE
Ketones, UA: NEGATIVE
Leukocytes, UA: NEGATIVE
Nitrite, UA: NEGATIVE
Protein, UA: NEGATIVE
Spec Grav, UA: 1.025 (ref 1.010–1.025)
Urobilinogen, UA: NEGATIVE E.U./dL — AB
pH, UA: 5 (ref 5.0–8.0)

## 2019-12-30 NOTE — Progress Notes (Addendum)
H&P  Chief Complaint: BPH w/ LUTS   History of Present Illness:   2.2.2021: Here today for follow-up -- in the interval he has moved quite a long distance away and this might be his last visit with our office. He reports that he has been doing well in the last year with minimal urological concerns. He denies any voiding sx's nor issues with erectile fxn. Most recent PSA was 14.8 on 1.21.2021.   IPSS Questionnaire (AUA-7): Over the past month.   1)  How often have you had a sensation of not emptying your bladder completely after you finish urinating?  0 - Not at all  2)  How often have you had to urinate again less than two hours after you finished urinating? 3 - About half the time  3)  How often have you found you stopped and started again several times when you urinated?  0 - Not at all  4) How difficult have you found it to postpone urination?  1 - Less than 1 time in 5  5) How often have you had a weak urinary stream?  2 - Less than half the time  6) How often have you had to push or strain to begin urination?  0 - Not at all  7) How many times did you most typically get up to urinate from the time you went to bed until the time you got up in the morning?  1 - 1 time  Total score:  0-7 mildly symptomatic   8-19 moderately symptomatic   20-35 severely symptomatic    (below copied from [facility] records):  He was first seen here in 2016. Prior to that, he was followed by Dr. Jerre Simon. He had an elevated PSA, and he thinks that in approximately 2006 he underwent ultrasound and biopsy of his prostate, when his PSA was approximately 4. By history, that was negative. He was found to have a large prostate, and eventually, in 2011 after a period of urinary retention and catheter dependence, underwent a TURP here at [facility]. 18.5 g of tissue was resected, all benign.  He has had his PSA followed on a fairly regular basis. It has not been over 7 that I can see, but numbers in the 5-6 range  have been common over the past few years. He did not have a repeat ultrasound and biopsy of his prostate, with him having a stable PSA.   5.7.2019: Most recent PSA is up to 13.7  7.3.2019: MRI of prostate. No suspicious lesions, capsular penetration, bony abnormalities, adenopathy. Volume 76 ml.  8.20.2019: Repeat PSA 5.28.2019 13.0. He is here for TRUS/Bx. Prostate volume 76 ml. All 12 cores were negative for cancer.   6.2.2020: Most recent PSA 3.4. F/T ratio 16. (PSA drawn about the same time by Dr. Margo Aye was 14.4).   01/23/19 01/09/19 04/23/18 03/20/18 03/12/17 08/23/16 08/23/16 05/23/16  PSA  Total PSA 3.4 ng/dl 16.1 ng/dl 09.6 ng/dl 04.5 ng/dl 6.8 ng/dl 6.6 ng/dl 6.6  4.09 ng/dl  Free PSA 0.6 ng/dl         % Free PSA 18 %           Past Medical History:  Diagnosis Date  . Bundle branch block, left     Past Surgical History:  Procedure Laterality Date  . COLONOSCOPY N/A 04/29/2013   Procedure: COLONOSCOPY;  Surgeon: Dalia Heading, MD;  Location: [facility];  Service: Gastroenterology;  Laterality: N/A;  . TONSILLECTOMY    .  TRANSURETHRAL RESECTION OF PROSTATE      Home Medications:  Allergies as of 12/30/2019   No Known Allergies     Medication List       Accurate as of December 30, 2019  3:11 PM. If you have any questions, ask your nurse or doctor.        aspirin EC 81 MG tablet Take 81 mg by mouth daily.   ezetimibe-simvastatin 10-20 MG tablet Commonly known as: VYTORIN Take 0.5 tablets by mouth every other day.   rosuvastatin 10 MG tablet Commonly known as: CRESTOR Take 10 mg by mouth daily.       Allergies: No Known Allergies  No family history on file.  Social History:  reports that he quit smoking about 31 years ago. He does not have any smokeless tobacco history on file. He reports current drug use. No history on file for alcohol.  ROS: A complete review of systems was performed.  All systems are negative except for pertinent findings as  noted.  Physical Exam:  Vital signs in last 24 hours: BP 139/84   Pulse 75   Temp 98.7 F (37.1 C)   Ht 5\' 9"  (1.753 m)   Wt 165 lb (74.8 kg)   BMI 24.37 kg/m  Constitutional:  Alert and oriented, No acute distress Cardiovascular: Regular rate  Respiratory: Normal respiratory effort GI: Abdomen is soft, nontender, nondistended, no abdominal masses. No CVAT. No hernias. Genitourinary: Normal male phallus, testes are descended bilaterally and non-tender and without masses, scrotum is normal in appearance without lesions or masses, perineum is normal on inspection. Prostate feels around 80 grams in size.  Lymphatic: No lymphadenopathy Neurologic: Grossly intact, no focal deficits Psychiatric: Normal mood and affect  Laboratory Data:  No results for input(s): WBC, HGB, HCT, PLT in the last 72 hours.  No results for input(s): NA, K, CL, GLUCOSE, BUN, CALCIUM, CREATININE in the last 72 hours.  Invalid input(s): CO3   Results for orders placed or performed in visit on 12/30/19 (from the past 24 hour(s))  POCT urinalysis dipstick     Status: Abnormal   Collection Time: 12/30/19  2:44 PM  Result Value Ref Range   Color, UA yellow    Clarity, UA clear    Glucose, UA Negative Negative   Bilirubin, UA neg    Ketones, UA neg    Spec Grav, UA 1.025 1.010 - 1.025   Blood, UA large    pH, UA 5.0 5.0 - 8.0   Protein, UA Negative Negative   Urobilinogen, UA negative (A) 0.2 or 1.0 E.U./dL   Nitrite, UA neg    Leukocytes, UA Negative Negative   Appearance clear    Odor     No results found for this or any previous visit (from the past 240 hour(s)).  Renal Function: No results for input(s): CREATININE in the last 168 hours. CrCl cannot be calculated (Patient's most recent lab result is older than the maximum 21 days allowed.).  Radiologic Imaging: No results found.  Impression/Assessment:  Recent PSA is high but this is in line with his general trend of PSA's around 13-14. He  has had previous negative MRI and pathology for tissue resected from his TURP. Not on any BPH meds with very minor urinary sx's -- no need for surgery or even changes to management at this point.   Plan:  1. He will be transitioning care to another provider closer to home -- he has not yet found a physician but  we will send records when requested.   2. No need to start on BPH medications and especially no reason for him to undergo an operation like urolift at this point (we discussed this procedure at his request)

## 2019-12-30 NOTE — Progress Notes (Signed)
